# Patient Record
Sex: Male | Born: 1970 | Race: Black or African American | Hispanic: No | Marital: Married | Smoking: Former smoker
Health system: Southern US, Community
[De-identification: ages and names within clinical notes are randomized; demographics above are authoritative.]

## PROBLEM LIST (undated history)

## (undated) DIAGNOSIS — I10 Essential (primary) hypertension: Secondary | ICD-10-CM

## (undated) HISTORY — PX: BACK SURGERY: SHX140

---

## 2008-06-17 ENCOUNTER — Ambulatory Visit (HOSPITAL_COMMUNITY): Admission: RE | Admit: 2008-06-17 | Discharge: 2008-06-17 | Payer: Self-pay | Admitting: Family Medicine

## 2009-04-22 ENCOUNTER — Ambulatory Visit (HOSPITAL_COMMUNITY): Admission: RE | Admit: 2009-04-22 | Discharge: 2009-04-23 | Payer: Self-pay | Admitting: Orthopaedic Surgery

## 2011-02-23 LAB — COMPREHENSIVE METABOLIC PANEL
Albumin: 4 g/dL (ref 3.5–5.2)
BUN: 6 mg/dL (ref 6–23)
Chloride: 107 mEq/L (ref 96–112)
Creatinine, Ser: 0.85 mg/dL (ref 0.4–1.5)
Glucose, Bld: 79 mg/dL (ref 70–99)
Total Bilirubin: 0.6 mg/dL (ref 0.3–1.2)

## 2011-02-23 LAB — URINALYSIS, ROUTINE W REFLEX MICROSCOPIC
Hgb urine dipstick: NEGATIVE
Nitrite: NEGATIVE
Specific Gravity, Urine: 1.029 (ref 1.005–1.030)
Urobilinogen, UA: 1 mg/dL (ref 0.0–1.0)

## 2011-02-23 LAB — URINE MICROSCOPIC-ADD ON

## 2011-02-23 LAB — CBC
HCT: 50.2 % (ref 39.0–52.0)
MCV: 92.1 fL (ref 78.0–100.0)
Platelets: 194 10*3/uL (ref 150–400)
RDW: 12.8 % (ref 11.5–15.5)

## 2011-03-31 NOTE — Op Note (Signed)
NAME:  Matthew Beltran, Matthew Beltran               ACCOUNT NO.:  [id number]   MEDICAL RECORD NO.:  [id number]          PATIENT TYPE:  OIB   LOCATION:  5036                         FACILITY:  [facility]   PHYSICIAN:  Mark C. Ophelia Charter, M.D.    DATE OF BIRTH:  15-Oct-1971   DATE OF PROCEDURE:  04/22/2009  DATE OF DISCHARGE:                               OPERATIVE REPORT   PREOPERATIVE DIAGNOSIS:  Left L5-S1 herniated nucleus pulposus with  radiculopathy.   POSTOPERATIVE DIAGNOSIS:  Left L5-S1 herniated nucleus pulposus with  radiculopathy.   PROCEDURE:  Left L5-S1 microdiskectomy, removal of free fragment.   SURGEON:  Mark C. Ophelia Charter, MD   ASSISTANT:  Wende Neighbors, PA   ANESTHESIA:  GOT plus Marcaine skin local.   ESTIMATED BLOOD LOSS:  Minimal.   This 40 year old male has had back and left leg pain off and on with  incapacitating pain requiring narcotic usage for many weeks with leg  weakness, progressive motor deficit, inability to do the single-stance  toe raise secondary to gastroc soleus weakness and giving way in his  leg.  MRI scan shows a large HNP with caudal migration causing  significant central and left compression at L5-S1.   PROCEDURE:  After standard prepping and draping with the patient in the  prone position on chest rolls, arms at his side 90 and 90,  with rolled  yellow pads on the shoulders, back was prepped with DuraPrep.  A warmer  was placed as well as calf pumpers for DVT prophylaxis.  Surgical time-  out checklist was completed after prepping and draping with Betadine  biodrape and needle localization at L5-S1 with cross-table lateral x-ray  confirming the needle at the appropriate level.  A 2-cm incision was  made.  The patient very thin and subperiosteal dissection over the  lamina was performed with self-retaining retractor placed.  Small  laminotomy was performed and as soon as some of the ligamentum had been  removed, nerve root was extremely tight just moving slightly  at the  edge, a massive fragment was visualized and no second x-ray was  necessary due to the L5-S1 level with massive fragment immediately  identified with the operative microscope.  Nerve root was gently  retracted towards the midline.  Fragment was teased and multiple chunks  of disk were removed with micropituitary teasing more pieces with the  ball-tip nerve hook.  Disk space was tightened and only would allow the  passage of micropituitary straight as well as up and down biting.  Cloward curette was introduced.  There was minimal amount of disk  material remaining.  The patient had blown most to the disk out with the  herniation.  Fragments were removed.  Sac was removed where the fragment  had been sitting and passes were made with a hockey stick into the dura.  Foramina was enlarged and inspection around the nerve, central cephalad  caudad was performed with no remaining areas of compression.  Hockey  stick could be passed anterior to the dura with 180 degrees sweep with  no areas of compression.  Disk space was  irrigated.  The patient  received preoperative Ancef prior to incision.  After irrigation, final  passage were made, small vein was coagulated with bipolar cautery in the  gutter.  Bone was removed out to the level of the pedicle and then  the patient had layered closure with 0 Vicryl interrupted and the fascia  with 2-0 Vicryl subcutaneous tissue, 4-0 Vicryl subcuticular closure.  Dura was intact and Dermabond was applied.  Postop dressing and  transferred to the recovery room in stable condition.      Mark C. Ophelia Charter, M.D.  Electronically Signed     MCY/MEDQ  D:  04/22/2009  T:  04/23/2009  Job:  [id number]

## 2011-06-09 ENCOUNTER — Emergency Department (HOSPITAL_COMMUNITY): Payer: Managed Care, Other (non HMO)

## 2011-06-09 ENCOUNTER — Emergency Department (HOSPITAL_COMMUNITY)
Admission: EM | Admit: 2011-06-09 | Discharge: 2011-06-09 | Disposition: A | Discharge status: Home / Self Care | Payer: Managed Care, Other (non HMO) | Attending: Emergency Medicine | Admitting: Emergency Medicine

## 2011-06-09 DIAGNOSIS — M542 Cervicalgia: Secondary | ICD-10-CM | POA: Insufficient documentation

## 2011-06-09 DIAGNOSIS — Z9889 Other specified postprocedural states: Secondary | ICD-10-CM | POA: Insufficient documentation

## 2014-12-27 ENCOUNTER — Other Ambulatory Visit: Payer: Self-pay | Admitting: Orthopaedic Surgery

## 2014-12-27 DIAGNOSIS — M545 Low back pain: Secondary | ICD-10-CM

## 2014-12-31 ENCOUNTER — Other Ambulatory Visit: Payer: Managed Care, Other (non HMO)

## 2015-01-02 ENCOUNTER — Ambulatory Visit
Admission: RE | Admit: 2015-01-02 | Discharge: 2015-01-02 | Disposition: A | Discharge status: Home / Self Care | Payer: Managed Care, Other (non HMO) | Source: Ambulatory Visit | Attending: Orthopaedic Surgery | Admitting: Orthopaedic Surgery

## 2015-01-02 DIAGNOSIS — M545 Low back pain: Secondary | ICD-10-CM

## 2015-10-16 ENCOUNTER — Other Ambulatory Visit: Payer: Self-pay | Admitting: Surgery

## 2015-11-21 ENCOUNTER — Encounter (HOSPITAL_BASED_OUTPATIENT_CLINIC_OR_DEPARTMENT_OTHER): Payer: Self-pay | Admitting: *Deleted

## 2015-11-26 ENCOUNTER — Other Ambulatory Visit: Payer: Self-pay

## 2015-11-26 ENCOUNTER — Encounter (HOSPITAL_BASED_OUTPATIENT_CLINIC_OR_DEPARTMENT_OTHER)
Admission: RE | Admit: 2015-11-26 | Discharge: 2015-11-26 | Disposition: A | Discharge status: Home / Self Care | Payer: Managed Care, Other (non HMO) | Source: Ambulatory Visit | Attending: Surgery | Admitting: Surgery

## 2015-11-26 DIAGNOSIS — I10 Essential (primary) hypertension: Secondary | ICD-10-CM | POA: Diagnosis not present

## 2015-11-26 DIAGNOSIS — R221 Localized swelling, mass and lump, neck: Secondary | ICD-10-CM | POA: Diagnosis present

## 2015-11-26 DIAGNOSIS — I4510 Unspecified right bundle-branch block: Secondary | ICD-10-CM | POA: Diagnosis not present

## 2015-11-26 DIAGNOSIS — E7800 Pure hypercholesterolemia, unspecified: Secondary | ICD-10-CM | POA: Diagnosis not present

## 2015-11-26 DIAGNOSIS — Z87891 Personal history of nicotine dependence: Secondary | ICD-10-CM | POA: Diagnosis not present

## 2015-11-26 LAB — BASIC METABOLIC PANEL
Anion gap: 8 (ref 5–15)
CALCIUM: 8.7 mg/dL — AB (ref 8.9–10.3)
CHLORIDE: 104 mmol/L (ref 101–111)
CO2: 26 mmol/L (ref 22–32)
CREATININE: 1.05 mg/dL (ref 0.61–1.24)
Glucose, Bld: 149 mg/dL — ABNORMAL HIGH (ref 65–99)
Potassium: 3.8 mmol/L (ref 3.5–5.1)
SODIUM: 138 mmol/L (ref 135–145)

## 2015-11-27 NOTE — H&P (Signed)
  Matthew PaMichael Beltran  [facility] Patient #: [id number] DOB: Jun 07, 1971 Married / Language: English / Race: Black or African American Male   History of Present Illness Matthew Beltran(Jojuan Champney A. Magnus IvanBlackman MD; 10/16/2015 3:16 PM) The patient is a 45 year old male who presents with a complaint of Mass. This gentleman is referred to me by Dr. Knox RoyaltyEnrico Jones for evaluation of a left posterior neck mass. The patient reports noticing the mass approximately 8 months ago. It is slowly getting larger. It causes very mild discomfort. He has had no previous similar masses. He denies any trauma or drainage. He is otherwise healthy without complaints.   Other Problems  Back Pain Chest pain High blood pressure Hypercholesterolemia   Spinal Surgery - Lower Back  Diagnostic Studies History Gilmer Mor(Sonya Bynum, CMA;  Colonoscopy never  Allergies Lamar Laundry(Sonya Bynum, CMA; 1 No Known Drug Allergies11/30/2016  Medication History (Sonya Bynum, CMA;  AmLODIPine Besylate (5MG  Tablet, Oral) Active. Medications Reconciled  Social History Lamar Laundry(Sonya Bynum, CMA;  Alcohol use Remotely quit alcohol use. Caffeine use Carbonated beverages, Coffee. No drug use Tobacco use Former smoker.  Family History Gilmer Mor(Sonya Bynum, CMA;  Arthritis Mother. Hypertension Father, Mother, Sister. Migraine Headache Sister. Prostate Cancer Father. Thyroid problems Mother.    Review of Systems (Sonya Bynum CMA; 1 General Not Present- Appetite Loss, Chills, Fatigue, Fever, Night Sweats, Weight Gain and Weight Loss. Skin Not Present- Change in Wart/Mole, Dryness, Hives, Jaundice, New Lesions, Non-Healing Wounds, Rash and Ulcer. HEENT Present- Wears glasses/contact lenses. Not Present- Earache, Hearing Loss, Hoarseness, Nose Bleed, Oral Ulcers, Ringing in the Ears, Seasonal Allergies, Sinus Pain, Sore Throat, Visual Disturbances and Yellow Eyes. Respiratory Present- Snoring. Not Present- Bloody sputum, Chronic Cough,  Difficulty Breathing and Wheezing. Breast Not Present- Breast Mass, Breast Pain, Nipple Discharge and Skin Changes. Cardiovascular Present- Chest Pain. Not Present- Difficulty Breathing Lying Down, Leg Cramps, Palpitations, Rapid Heart Rate, Shortness of Breath and Swelling of Extremities. Gastrointestinal Not Present- Abdominal Pain, Bloating, Bloody Stool, Change in Bowel Habits, Chronic diarrhea, Constipation, Difficulty Swallowing, Excessive gas, Gets full quickly at meals, Hemorrhoids, Indigestion, Nausea, Rectal Pain and Vomiting. Male Genitourinary Not Present- Blood in Urine, Change in Urinary Stream, Frequency, Impotence, Nocturia, Painful Urination, Urgency and Urine Leakage.  Vitals (Sonya Bynum CMA;   Weight: 202 lb Height: 74in Body Surface Area: 2.18 m Body Mass Index: 25.93 kg/m  Temp.: 97.70F(Temporal)  Pulse: 74 (Regular)  BP: 128/70 (Sitting, Left Arm, Standard)       Physical Exam (Chloee Tena A. Magnus IvanBlackman MD;  The physical exam findings are as follows: Note:On physical examination, he is well appearance Lungs are clear bilaterally Cardiovascular is regular rate and rhythm There is a 2 cm left posterior neck mass which is slightly deep and mobile. I can palpate no anterior cervical adenopathy. There are no skin changes.    Assessment & Plan (Pollyanna Levay A. Magnus IvanBlackman MD;  MASS OF LEFT SIDE OF NECK (R22.1)  Impression: Left neck mass of uncertain etiology. Surgical excision of this is recommended for histologic evaluation to rule out malignancy. This may represent an enlarged lymph node as it is quite firm although it still may represent a lipoma or a dermoid tumor. I discussed the surgical procedure with him in detail. I discussed the risks of surgery which includes but is not limited to bleeding, infection, injury to surrounding structures, postoperative recovery, etc. He understands and wishes to proceed with surgery

## 2015-11-28 ENCOUNTER — Ambulatory Visit (HOSPITAL_BASED_OUTPATIENT_CLINIC_OR_DEPARTMENT_OTHER): Payer: Managed Care, Other (non HMO) | Admitting: Certified Registered"

## 2015-11-28 ENCOUNTER — Encounter (HOSPITAL_BASED_OUTPATIENT_CLINIC_OR_DEPARTMENT_OTHER)
Admission: RE | Disposition: A | Discharge status: Home / Self Care | Payer: Self-pay | Source: Ambulatory Visit | Attending: Surgery

## 2015-11-28 ENCOUNTER — Ambulatory Visit (HOSPITAL_BASED_OUTPATIENT_CLINIC_OR_DEPARTMENT_OTHER)
Admission: RE | Admit: 2015-11-28 | Discharge: 2015-11-28 | Disposition: A | Discharge status: Home / Self Care | Payer: Managed Care, Other (non HMO) | Source: Ambulatory Visit | Attending: Surgery | Admitting: Surgery

## 2015-11-28 ENCOUNTER — Encounter (HOSPITAL_BASED_OUTPATIENT_CLINIC_OR_DEPARTMENT_OTHER): Payer: Self-pay | Admitting: Certified Registered"

## 2015-11-28 DIAGNOSIS — Z87891 Personal history of nicotine dependence: Secondary | ICD-10-CM | POA: Insufficient documentation

## 2015-11-28 DIAGNOSIS — E7800 Pure hypercholesterolemia, unspecified: Secondary | ICD-10-CM | POA: Insufficient documentation

## 2015-11-28 DIAGNOSIS — R221 Localized swelling, mass and lump, neck: Secondary | ICD-10-CM | POA: Diagnosis not present

## 2015-11-28 DIAGNOSIS — I10 Essential (primary) hypertension: Secondary | ICD-10-CM | POA: Insufficient documentation

## 2015-11-28 DIAGNOSIS — I4510 Unspecified right bundle-branch block: Secondary | ICD-10-CM | POA: Insufficient documentation

## 2015-11-28 HISTORY — PX: CYST EXCISION: SHX5701

## 2015-11-28 HISTORY — DX: Essential (primary) hypertension: I10

## 2015-11-28 SURGERY — CYST REMOVAL
Anesthesia: General | Site: Neck | Laterality: Left

## 2015-11-28 MED ORDER — ONDANSETRON HCL 4 MG/2ML IJ SOLN: 4.0000 mg | Freq: Once | INTRAMUSCULAR | Status: DC | PRN

## 2015-11-28 MED ORDER — ONDANSETRON HCL 4 MG/2ML IJ SOLN
INTRAMUSCULAR | Status: AC
  Filled 2015-11-28: qty 2

## 2015-11-28 MED ORDER — SCOPOLAMINE 1 MG/3DAYS TD PT72: 1.0000 | MEDICATED_PATCH | Freq: Once | TRANSDERMAL | Status: DC | PRN

## 2015-11-28 MED ORDER — SODIUM CHLORIDE 0.9 % IJ SOLN: 3.0000 mL | Freq: Two times a day (BID) | INTRAMUSCULAR | Status: DC

## 2015-11-28 MED ORDER — HYDROCODONE-ACETAMINOPHEN 5-325 MG PO TABS: 1.0000 | ORAL_TABLET | ORAL | Status: AC | PRN

## 2015-11-28 MED ORDER — FENTANYL CITRATE (PF) 100 MCG/2ML IJ SOLN
50.0000 ug | INTRAMUSCULAR | Status: DC | PRN
  Administered 2015-11-28: 100 ug via INTRAVENOUS

## 2015-11-28 MED ORDER — PHENOL 1.4 % MT LIQD
2.0000 | OROMUCOSAL | Status: DC | PRN
  Administered 2015-11-28: 2 via OROMUCOSAL

## 2015-11-28 MED ORDER — OXYCODONE HCL 5 MG PO TABS: 5.0000 mg | ORAL_TABLET | ORAL | Status: DC | PRN

## 2015-11-28 MED ORDER — FENTANYL CITRATE (PF) 100 MCG/2ML IJ SOLN
INTRAMUSCULAR | Status: AC
  Filled 2015-11-28: qty 2

## 2015-11-28 MED ORDER — MIDAZOLAM HCL 2 MG/2ML IJ SOLN
1.0000 mg | INTRAMUSCULAR | Status: DC | PRN
  Administered 2015-11-28: 2 mg via INTRAVENOUS

## 2015-11-28 MED ORDER — GLYCOPYRROLATE 0.2 MG/ML IJ SOLN
INTRAMUSCULAR | Status: AC
  Filled 2015-11-28: qty 1

## 2015-11-28 MED ORDER — LIDOCAINE HCL (CARDIAC) 20 MG/ML IV SOLN
INTRAVENOUS | Status: AC
  Filled 2015-11-28: qty 5

## 2015-11-28 MED ORDER — GLYCOPYRROLATE 0.2 MG/ML IJ SOLN
0.2000 mg | Freq: Once | INTRAMUSCULAR | Status: AC | PRN
  Administered 2015-11-28: 0.2 mg via INTRAVENOUS

## 2015-11-28 MED ORDER — LIDOCAINE HCL (CARDIAC) 20 MG/ML IV SOLN
INTRAVENOUS | Status: DC | PRN
  Administered 2015-11-28: 100 mg via INTRAVENOUS

## 2015-11-28 MED ORDER — ACETAMINOPHEN 650 MG RE SUPP: 650.0000 mg | RECTAL | Status: DC | PRN

## 2015-11-28 MED ORDER — ACETAMINOPHEN 325 MG PO TABS: 650.0000 mg | ORAL_TABLET | ORAL | Status: DC | PRN

## 2015-11-28 MED ORDER — MIDAZOLAM HCL 2 MG/2ML IJ SOLN
INTRAMUSCULAR | Status: AC
  Filled 2015-11-28: qty 2

## 2015-11-28 MED ORDER — DEXAMETHASONE SODIUM PHOSPHATE 4 MG/ML IJ SOLN
INTRAMUSCULAR | Status: DC | PRN
  Administered 2015-11-28: 10 mg via INTRAVENOUS

## 2015-11-28 MED ORDER — SODIUM CHLORIDE 0.9 % IV SOLN
250.0000 mL | INTRAVENOUS | Status: DC | PRN
Start: 2015-11-28 — End: 2015-11-28

## 2015-11-28 MED ORDER — DEXAMETHASONE SODIUM PHOSPHATE 10 MG/ML IJ SOLN
INTRAMUSCULAR | Status: AC
  Filled 2015-11-28: qty 1

## 2015-11-28 MED ORDER — FENTANYL CITRATE (PF) 100 MCG/2ML IJ SOLN: 25.0000 ug | INTRAMUSCULAR | Status: DC | PRN

## 2015-11-28 MED ORDER — MORPHINE SULFATE (PF) 2 MG/ML IV SOLN: 1.0000 mg | INTRAVENOUS | Status: DC | PRN

## 2015-11-28 MED ORDER — ONDANSETRON HCL 4 MG/2ML IJ SOLN
INTRAMUSCULAR | Status: DC | PRN
  Administered 2015-11-28: 4 mg via INTRAVENOUS

## 2015-11-28 MED ORDER — PROPOFOL 10 MG/ML IV BOLUS
INTRAVENOUS | Status: DC | PRN
  Administered 2015-11-28: 300 mg via INTRAVENOUS
  Administered 2015-11-28: 200 mg via INTRAVENOUS

## 2015-11-28 MED ORDER — BUPIVACAINE-EPINEPHRINE 0.5% -1:200000 IJ SOLN
INTRAMUSCULAR | Status: DC | PRN
  Administered 2015-11-28: 10 mL

## 2015-11-28 MED ORDER — SODIUM CHLORIDE 0.9 % IJ SOLN: 3.0000 mL | INTRAMUSCULAR | Status: DC | PRN

## 2015-11-28 MED ORDER — PHENOL 1.4 % MT LIQD
1.0000 | OROMUCOSAL | Status: DC | PRN
  Filled 2015-11-28: qty 177

## 2015-11-28 MED ORDER — LACTATED RINGERS IV SOLN
INTRAVENOUS | Status: DC
  Administered 2015-11-28: 10 mL/h via INTRAVENOUS
  Administered 2015-11-28: 13:00:00 via INTRAVENOUS

## 2015-11-28 MED ORDER — CEFAZOLIN SODIUM-DEXTROSE 2-3 GM-% IV SOLR
2.0000 g | INTRAVENOUS | Status: AC
  Administered 2015-11-28: 2 g via INTRAVENOUS

## 2015-11-28 MED ORDER — CEFAZOLIN SODIUM-DEXTROSE 2-3 GM-% IV SOLR
INTRAVENOUS | Status: AC
  Filled 2015-11-28: qty 50

## 2015-11-28 SURGICAL SUPPLY — 36 items
BLADE HEX COATED 2.75 (ELECTRODE) ×2 IMPLANT
BLADE SURG 15 STRL LF DISP TIS (BLADE) ×1 IMPLANT
BLADE SURG 15 STRL SS (BLADE) ×2
CANISTER SUCT 1200ML W/VALVE (MISCELLANEOUS) IMPLANT
CHLORAPREP W/TINT 26ML (MISCELLANEOUS) ×2 IMPLANT
COVER BACK TABLE 60X90IN (DRAPES) ×2 IMPLANT
COVER MAYO STAND STRL (DRAPES) ×2 IMPLANT
DECANTER SPIKE VIAL GLASS SM (MISCELLANEOUS) IMPLANT
DRAPE LAPAROTOMY 100X72 PEDS (DRAPES) ×2 IMPLANT
DRAPE UTILITY XL STRL (DRAPES) ×2 IMPLANT
ELECT REM PT RETURN 9FT ADLT (ELECTROSURGICAL) ×2
ELECTRODE REM PT RTRN 9FT ADLT (ELECTROSURGICAL) ×1 IMPLANT
GLOVE BIOGEL PI IND STRL 6.5 (GLOVE) IMPLANT
GLOVE BIOGEL PI INDICATOR 6.5 (GLOVE) ×1
GLOVE ECLIPSE 6.5 STRL STRAW (GLOVE) ×1 IMPLANT
GLOVE SURG SIGNA 7.5 PF LTX (GLOVE) ×2 IMPLANT
GOWN STRL REUS W/ TWL LRG LVL3 (GOWN DISPOSABLE) ×1 IMPLANT
GOWN STRL REUS W/ TWL XL LVL3 (GOWN DISPOSABLE) ×1 IMPLANT
GOWN STRL REUS W/TWL LRG LVL3 (GOWN DISPOSABLE) ×2
GOWN STRL REUS W/TWL XL LVL3 (GOWN DISPOSABLE) ×2
LIQUID BAND (GAUZE/BANDAGES/DRESSINGS) ×4 IMPLANT
NDL HYPO 25X1 1.5 SAFETY (NEEDLE) ×1 IMPLANT
NEEDLE HYPO 25X1 1.5 SAFETY (NEEDLE) ×2 IMPLANT
NS IRRIG 1000ML POUR BTL (IV SOLUTION) IMPLANT
PACK BASIN DAY SURGERY FS (CUSTOM PROCEDURE TRAY) ×2 IMPLANT
PENCIL BUTTON HOLSTER BLD 10FT (ELECTRODE) ×2 IMPLANT
SLEEVE SCD COMPRESS KNEE MED (MISCELLANEOUS) ×1 IMPLANT
SPONGE LAP 4X18 X RAY DECT (DISPOSABLE) ×2 IMPLANT
SUT MNCRL AB 4-0 PS2 18 (SUTURE) ×2 IMPLANT
SUT VIC AB 3-0 SH 27 (SUTURE) ×2
SUT VIC AB 3-0 SH 27X BRD (SUTURE) ×1 IMPLANT
SYR CONTROL 10ML LL (SYRINGE) ×2 IMPLANT
TOWEL OR 17X24 6PK STRL BLUE (TOWEL DISPOSABLE) ×2 IMPLANT
TOWEL OR NON WOVEN STRL DISP B (DISPOSABLE) ×2 IMPLANT
TUBE CONNECTING 20X1/4 (TUBING) IMPLANT
YANKAUER SUCT BULB TIP NO VENT (SUCTIONS) IMPLANT

## 2015-11-28 NOTE — Discharge Instructions (Signed)
Ok to shower starting tomorrow ° °Ice pack and ibuprofen also for pain ° ° ° °Post Anesthesia Home Care Instructions ° °Activity: °Get plenty of rest for the remainder of the day. A responsible adult should stay with you for 24 hours following the procedure.  °For the next 24 hours, DO NOT: °-Drive a car °-Operate machinery °-Drink alcoholic beverages °-Take any medication unless instructed by your physician °-Make any legal decisions or sign important papers. ° °Meals: °Start with liquid foods such as gelatin or soup. Progress to regular foods as tolerated. Avoid greasy, spicy, heavy foods. If nausea and/or vomiting occur, drink only clear liquids until the nausea and/or vomiting subsides. Call your physician if vomiting continues. ° °Special Instructions/Symptoms: °Your throat may feel dry or sore from the anesthesia or the breathing tube placed in your throat during surgery. If this causes discomfort, gargle with warm salt water. The discomfort should disappear within 24 hours. ° °If you had a scopolamine patch placed behind your ear for the management of post- operative nausea and/or vomiting: ° °1. The medication in the patch is effective for 72 hours, after which it should be removed.  Wrap patch in a tissue and discard in the trash. Wash hands thoroughly with soap and water. °2. You may remove the patch earlier than 72 hours if you experience unpleasant side effects which may include dry mouth, dizziness or visual disturbances. °3. Avoid touching the patch. Wash your hands with soap and water after contact with the patch. °  ° °

## 2015-11-28 NOTE — Op Note (Signed)
EXCISION LEFT NECK MASS  Procedure Note  Willette PaMichael Zieger 11/28/2015   Pre-op Diagnosis: Left neck mass     Post-op Diagnosis: same  Procedure(s): EXCISION LEFT NECK MASS (2 cm)  Surgeon(s): Abigail Miyamotoouglas Mulki Roesler, MD  Anesthesia: General  Staff:  Circulator: Bernita RaisinJolie M Wells, RN Scrub Person: Salley ScarletMary B Davidson, RN  Estimated Blood Loss: Minimal               Specimens: sent to path          Jenn[facility] Stuart Medical CenterBLACKMAN,Madelein Mahadeo A   Date: 11/28/2015  Time: 11:53 AM

## 2015-11-28 NOTE — Interval H&P Note (Signed)
History and Physical Interval Note: NO CHANGE IN H and P  11/28/2015 11:07 AM  Matthew Beltran  has presented today for surgery, with the diagnosis of Left neck mass  The various methods of treatment have been discussed with the patient and family. After consideration of risks, benefits and other options for treatment, the patient has consented to  Procedure(s): EXCISION LEFT NECK MASS (Left) as a surgical intervention .  The patient's history has been reviewed, patient examined, no change in status, stable for surgery.  I have reviewed the patient's chart and labs.  Questions were answered to the patient's satisfaction.     [name]

## 2015-11-28 NOTE — Transfer of Care (Signed)
Immediate Anesthesia Transfer of Care Note  Patient: Matthew Beltran  Procedure(s) Performed: Procedure(s): EXCISION LEFT NECK MASS (Left)  Patient Location: PACU  Anesthesia Type:General  Level of Consciousness: sedated, lethargic and responds to stimulation  Airway & Oxygen Therapy: Patient Spontanous Breathing and Patient connected to face mask oxygen  Post-op Assessment: Report given to RN and Post -op Vital signs reviewed and stable  Post vital signs: Reviewed and stable  Last Vitals:  Filed Vitals:   11/28/15 1031  BP: 127/83  Pulse: 73  Temp: 36.8 C  Resp: 20    Complications: No apparent anesthesia complications

## 2015-11-28 NOTE — Progress Notes (Signed)
sweeling at lat lt neck away from incision site non tender to palpation ,  Dr Katrinka Blazingsmith in to see. bilat bs clear..  No creptus in area .  Just watch per dr Katrinka Blazingsmith  Swallowing  without problem,  No active bleeding noted in back of throat

## 2015-11-28 NOTE — Anesthesia Procedure Notes (Signed)
Procedure Name: LMA Insertion Date/Time: 11/28/2015 11:29 AM Performed by: Curly ShoresRAFT, Yecheskel Kurek W Pre-anesthesia Checklist: Patient identified, Emergency Drugs available, Suction available and Patient being monitored Patient Re-evaluated:Patient Re-evaluated prior to inductionOxygen Delivery Method: Circle System Utilized Preoxygenation: Pre-oxygenation with 100% oxygen Intubation Type: IV induction Ventilation: Mask ventilation without difficulty LMA: LMA inserted and LMA flexible inserted LMA Size: 5.0 Number of attempts: 2 (unable to seat flexible LMA.  #5 unique inserted easily) Airway Equipment and Method: Bite block Placement Confirmation: positive ETCO2 Tube secured with: Tape Dental Injury: Teeth and Oropharynx as per pre-operative assessment

## 2015-11-28 NOTE — Anesthesia Preprocedure Evaluation (Signed)
Anesthesia Evaluation  Patient identified by MRN, date of birth, ID band Patient awake    Reviewed: Allergy & Precautions, H&P , NPO status , Patient's Chart, lab work & pertinent test results  History of Anesthesia Complications Negative for: history of anesthetic complications  Airway Mallampati: II  TM Distance: >3 FB Neck ROM: full    Dental no notable dental hx.    Pulmonary former smoker,    Pulmonary exam normal breath sounds clear to auscultation       Cardiovascular hypertension, On Medications Normal cardiovascular exam Rhythm:regular Rate:Normal     Neuro/Psych negative neurological ROS     GI/Hepatic negative GI ROS, Neg liver ROS,   Endo/Other  negative endocrine ROS  Renal/GU negative Renal ROS     Musculoskeletal   Abdominal   Peds  Hematology negative hematology ROS (+)   Anesthesia Other Findings Left neck mass  Reproductive/Obstetrics negative OB ROS                             Anesthesia Physical Anesthesia Plan  ASA: II  Anesthesia Plan: General   Post-op Pain Management:    Induction: Intravenous  Airway Management Planned: Oral ETT  Additional Equipment:   Intra-op Plan:   Post-operative Plan: Extubation in OR  Informed Consent: I have reviewed the patients History and Physical, chart, labs and discussed the procedure including the risks, benefits and alternatives for the proposed anesthesia with the patient or authorized representative who has indicated his/her understanding and acceptance.   Dental Advisory Given  Plan Discussed with: Anesthesiologist, CRNA and Surgeon  Anesthesia Plan Comments:         Anesthesia Quick Evaluation

## 2015-11-29 ENCOUNTER — Encounter (HOSPITAL_BASED_OUTPATIENT_CLINIC_OR_DEPARTMENT_OTHER): Payer: Self-pay | Admitting: Surgery

## 2015-11-29 NOTE — Op Note (Signed)
[name]:  Finklea, Vitali               ACCOUNT NO.:  [id number]  MEDICAL RECORD NO.:  [id number]  LOCATION:                                 FACILITY:  PHYSICIAN:  Abigail Miyamotoouglas Waymond Meador, M.D.      DATE OF BIRTH:  DATE OF PROCEDURE:  11/28/2015 DATE OF DISCHARGE:                              OPERATIVE REPORT   PREOPERATIVE DIAGNOSIS:  Left neck mass.  POSTOPERATIVE DIAGNOSIS:  Left neck mass.  PROCEDURE:  Excision of 2 cm left neck mass.  SURGEON:  Abigail Miyamotoouglas Katherene Dinino, M.D.  ANESTHESIA:  General with 0.5% Marcaine.  ESTIMATED BLOOD LOSS:  Minimal.  FINDINGS:  The patient was found to have a 2 cm mass, which appeared consistent with a lipoma.  It was sent to Pathology for evaluation.  PROCEDURE IN DETAIL:  The patient was brought to the operating room, identified as Matthew PaMichael Cdebaca, he was placed supine on the operating room table, where general anesthesia was induced.  His head was then turned to the right.  His neck was prepped and draped in usual sterile fashion. The palpable mass was in the posterior triangle.  I anesthetized skin over the mass with lidocaine and made incision with a scalpel.  I took this down through his very thick skin and platysma with electrocautery. I then was able to easily identify the mass which had the appearance of a lipoma.  I was able to grasp it with a clamp, elevate it, and excise with electrocautery.  It was then sent to Pathology for evaluation. Hemostasis was achieved with cautery.  I then anesthetized the wound further with Marcaine.  I closed the platysma with interrupted 3-0 Vicryl sutures and closed skin with a running 4 Monocryl.  Skin glue was then applied.  The patient tolerated the procedure well.  All the counts were correct at the end of procedure.  The patient was then extubated in the operating room and taken in stable condition to recovery room.     Abigail Miyamotoouglas Rainer Mounce, M.D.     DB/MEDQ  D:  11/28/2015  T:  11/28/2015  Job:  [id number]

## 2015-12-02 NOTE — Anesthesia Postprocedure Evaluation (Signed)
Anesthesia Post Note  Patient: Matthew Beltran  Procedure(s) Performed: Procedure(s) (LRB): EXCISION LEFT NECK MASS (Left)  Patient location during evaluation: PACU Anesthesia Type: General Level of consciousness: awake and alert Pain management: pain level controlled Vital Signs Assessment: post-procedure vital signs reviewed and stable Respiratory status: spontaneous breathing, nonlabored ventilation and respiratory function stable Cardiovascular status: blood pressure returned to baseline and stable Postop Assessment: no signs of nausea or vomiting Anesthetic complications: no    Last Vitals:  Filed Vitals:   11/28/15 1330 11/28/15 1359  BP: 128/101 142/95  Pulse: 74 70  Temp:  36.5 C  Resp: 13 18    Last Pain:  Filed Vitals:   11/29/15 0958  PainSc: 1                  Matthew Beltran, Matthew Beltran

## 2016-09-03 ENCOUNTER — Ambulatory Visit (INDEPENDENT_AMBULATORY_CARE_PROVIDER_SITE_OTHER): Payer: Managed Care, Other (non HMO) | Admitting: Sports Medicine

## 2016-09-03 ENCOUNTER — Encounter (INDEPENDENT_AMBULATORY_CARE_PROVIDER_SITE_OTHER): Payer: Self-pay | Admitting: Sports Medicine

## 2016-09-03 DIAGNOSIS — M4726 Other spondylosis with radiculopathy, lumbar region: Secondary | ICD-10-CM | POA: Diagnosis not present

## 2016-09-03 DIAGNOSIS — M5442 Lumbago with sciatica, left side: Secondary | ICD-10-CM | POA: Diagnosis not present

## 2016-09-03 DIAGNOSIS — M5441 Lumbago with sciatica, right side: Secondary | ICD-10-CM

## 2016-09-07 ENCOUNTER — Other Ambulatory Visit: Payer: Self-pay | Admitting: Sports Medicine

## 2016-09-07 ENCOUNTER — Ambulatory Visit
Admission: RE | Admit: 2016-09-07 | Discharge: 2016-09-07 | Disposition: A | Discharge status: Home / Self Care | Payer: Managed Care, Other (non HMO) | Source: Ambulatory Visit | Attending: Sports Medicine | Admitting: Sports Medicine

## 2016-09-07 DIAGNOSIS — M545 Low back pain: Principal | ICD-10-CM

## 2016-09-07 DIAGNOSIS — G8929 Other chronic pain: Secondary | ICD-10-CM

## 2016-09-11 ENCOUNTER — Telehealth (INDEPENDENT_AMBULATORY_CARE_PROVIDER_SITE_OTHER): Payer: Self-pay | Admitting: Sports Medicine

## 2016-09-11 DIAGNOSIS — M5417 Radiculopathy, lumbosacral region: Secondary | ICD-10-CM

## 2016-09-11 NOTE — Telephone Encounter (Signed)
See msg concerning MRI results.

## 2016-09-11 NOTE — Telephone Encounter (Signed)
Talked with pt. And advised him of message.  Patient stated that he would try the epidural steroid injection if that is what you are recommending. Thank You

## 2016-09-11 NOTE — Telephone Encounter (Signed)
Patient called regarding MRI results.  Contact Info: [phone]

## 2016-09-11 NOTE — Telephone Encounter (Signed)
The patient was supposed to schedule a follow-up for MRI review. If he is having persistent symptoms we could consider referring him to Dr. Alvester MorinNewton for an epidural steroid injection but his MRI is essentially unchanged from prior and does not show an significant nerve root irritation although there are some changes present. Once again I'm happy to discuss this with him in a follow-up visit or if he would prefer to undergo an epidural steroid injection we can refer him to Dr. Alvester MorinNewton.

## 2016-09-23 ENCOUNTER — Ambulatory Visit (INDEPENDENT_AMBULATORY_CARE_PROVIDER_SITE_OTHER): Payer: Self-pay | Admitting: Orthopaedic Surgery

## 2016-09-28 ENCOUNTER — Encounter (INDEPENDENT_AMBULATORY_CARE_PROVIDER_SITE_OTHER): Payer: Self-pay | Admitting: Physical Medicine and Rehabilitation

## 2016-09-28 ENCOUNTER — Ambulatory Visit (INDEPENDENT_AMBULATORY_CARE_PROVIDER_SITE_OTHER): Payer: Managed Care, Other (non HMO) | Admitting: Physical Medicine and Rehabilitation

## 2016-09-28 VITALS — BP 142/106 | HR 80 | Temp 98.3°F

## 2016-09-28 DIAGNOSIS — M961 Postlaminectomy syndrome, not elsewhere classified: Secondary | ICD-10-CM

## 2016-09-28 DIAGNOSIS — M5416 Radiculopathy, lumbar region: Secondary | ICD-10-CM

## 2016-09-28 MED ORDER — LIDOCAINE HCL (PF) 1 % IJ SOLN
0.3300 mL | Freq: Once | INTRAMUSCULAR | Status: AC
  Administered 2016-09-28: 0.3 mL

## 2016-09-28 MED ORDER — METHYLPREDNISOLONE ACETATE 80 MG/ML IJ SUSP
80.0000 mg | Freq: Once | INTRAMUSCULAR | Status: AC
  Administered 2016-09-28: 80 mg

## 2016-09-28 NOTE — Patient Instructions (Signed)

## 2016-09-28 NOTE — Progress Notes (Signed)
Matthew Beltran - 45 y.o. male MRN [id number]  Date of birth: 09-Oct-1971  Office Visit Note: Visit Date: 09/28/2016 PCP: Paulino RilyJONES,ENRICO G, MD Referred by: Knox RoyaltyJones, Enrico, MD  Subjective: Chief Complaint  Patient presents with  . Lower Back - Pain   HPI: Mr. Matthew Beltran is a 45 year old gentleman with chronic history of back and hip and leg pain particularly on the left. He reports low back pain off and on for years. Persistent pain x 3 weeks. Sharp pain at times. No rhyme or reason. Radiating down both legs. Right equal to left. Wakes with pain at times. He denies any focal weakness. He has been seen and evaluated by Dr. Berline Choughigby who thought an epidural injection may be beneficial. The patient has failed conservative care. The patient has had an updated MRI which is reviewed below.    ROS Otherwise per HPI.  Assessment & Plan: Visit Diagnoses:  1. Lumbar radiculopathy   2. Post laminectomy syndrome     Plan: Findings:  Chronic worsening low back and bilateral hip and thigh and leg pain consistent more with discogenic type referral pattern. He doesn't have much the way of facet arthropathy on MRI. I think bilateral S1 transforaminal injections may get medication around the level of L5-S1 diagnostically and hopefully therapeutically. Depending on the relief she gets with this he may ultimately be a candidate for spinal cord stimulator if it was just something that was ongoing and persistent and recalcitrant. He has failed to a lot of conservative care. Consideration should be given to that indication tight management with nerve membrane type medications and other neuropathic pain medications.    Meds & Orders:  Meds ordered this encounter  Medications  . lidocaine (PF) (XYLOCAINE) 1 % injection 0.3 mL  . methylPREDNISolone acetate (DEPO-MEDROL) injection 80 mg    Orders Placed This Encounter  Procedures  . Epidural Steroid injection    Follow-up: Return for schedule for Dr. Berline Choughigby follow up 2  weeks.   Procedures: No procedures performed  S1 Lumbosacral Transforaminal Epidural Steroid Injection - Sub-Pedicular Approach with Fluoroscopic Guidance   Patient: Matthew PaMichael Beltran      Date of Birth: 09-Oct-1971 MRN: [id number] PCP: Paulino RilyJONES,ENRICO G, MD      Visit Date: 09/28/2016   Universal Protocol:    Date/Time: 09/28/1709:09 AM  Consent Given By: the patient  Position:  PRONE  Additional Comments: Vital signs were monitored before and after the procedure. Patient was prepped and draped in the usual sterile fashion. The correct patient, procedure, and site was verified.   Injection Procedure Details:  Procedure Site One Meds Administered:  Meds ordered this encounter  Medications  . lidocaine (PF) (XYLOCAINE) 1 % injection 0.3 mL  . methylPREDNISolone acetate (DEPO-MEDROL) injection 80 mg     Laterality: Bilateral  Location/Site:  S1 Foramen   Needle size: 22 G  Needle type: Spinal  Needle Placement: Transforaminal  Findings:  -Contrast Used: 2 mL iohexol 180 mg iodine/mL   -Comments: Excellent flow of contrast along the nerve and into the epidural space.  Procedure Details: After squaring off the sacral end-plate to get a true AP view, the C-arm was positioned so that the best possible view of the S1 foramen was visualized. The soft tissues overlying this structure were infiltrated with 2-3 ml. of 1% Lidocaine without Epinephrine.    The spinal needle was inserted toward the target using a "trajectory" view along the fluoroscope beam.  Under AP and lateral visualization, the needle was advanced so it  did not puncture dura. Biplanar projections were used to confirm position. Aspiration was confirmed to be negative for CSF and/or blood. A 1-2 ml. volume of Isovue-250 was injected and flow of contrast was noted at each level. Radiographs were obtained for documentation purposes.   After attaining the desired flow of contrast documented above, a 0.5 to 1.0 ml test  dose of 0.25% Marcaine was injected into each respective transforaminal space.  The patient was observed for 90 seconds post injection.  After no sensory deficits were reported, and normal lower extremity motor function was noted,   the above injectate was administered so that equal amounts of the injectate were placed at each foramen (level) into the transforaminal epidural space.   Additional Comments:  The patient tolerated the procedure well Dressing: Band-Aid and 2x2 sterile gauze     Post-procedure details: Patient was observed during the procedure. Post-procedure instructions were reviewed.  Patient left the clinic in stable condition.     Clinical History: Lumbar spine MRI dated 09/07/2016  IMPRESSION: Unchanged appearance of the lumbar spine. Disc degeneration and postoperative changes at L5-S1 and L4-5 disc protrusion without evidence of neural impingement.   He reports that he quit smoking about 12 months ago. He has never used smokeless tobacco. No results for input(s): HGBA1C, LABURIC in the last 8760 hours.  Objective:  VS:  HT:    WT:   BMI:     BP:(!) 142/106  HR:80bpm  TEMP:98.3 F (36.8 C)( )  RESP:98 % Physical Exam  Musculoskeletal:  The patient ambulates without aid. He has good distal strength. He has tight hamstrings bilaterally.    Ortho Exam Imaging: No results found.  Past Medical/Family/Surgical/Social History: Medications & Allergies reviewed per EMR There are no active problems to display for this patient.  Past Medical History:  Diagnosis Date  . Hypertension    No family history on file. Past Surgical History:  Procedure Laterality Date  . BACK SURGERY    . CYST EXCISION Left 11/28/2015   Procedure: EXCISION LEFT NECK MASS;  Surgeon: Abigail Miyamotoouglas Blackman, MD;  Location: [facility];  Service: General;  Laterality: Left;   Social History   Occupational History  . Not on file.   Social History Main Topics  . Smoking  status: Former Smoker    Quit date: 09/10/2015  . Smokeless tobacco: Never Used  . Alcohol use No  . Drug use: No  . Sexual activity: Not on file

## 2016-09-28 NOTE — Procedures (Signed)
S1 Lumbosacral Transforaminal Epidural Steroid Injection - Sub-Pedicular Approach with Fluoroscopic Guidance   Patient: Matthew Beltran      Date of Birth: 1971/10/28 MRN: [id number] PCP: Paulino RilyJONES,ENRICO G, MD      Visit Date: 09/28/2016   Universal Protocol:    Date/Time: 09/28/1709:09 AM  Consent Given By: the patient  Position:  PRONE  Additional Comments: Vital signs were monitored before and after the procedure. Patient was prepped and draped in the usual sterile fashion. The correct patient, procedure, and site was verified.   Injection Procedure Details:  Procedure Site One Meds Administered:  Meds ordered this encounter  Medications  . lidocaine (PF) (XYLOCAINE) 1 % injection 0.3 mL  . methylPREDNISolone acetate (DEPO-MEDROL) injection 80 mg     Laterality: Bilateral  Location/Site:  S1 Foramen   Needle size: 22 G  Needle type: Spinal  Needle Placement: Transforaminal  Findings:  -Contrast Used: 2 mL iohexol 180 mg iodine/mL   -Comments: Excellent flow of contrast along the nerve and into the epidural space.  Procedure Details: After squaring off the sacral end-plate to get a true AP view, the C-arm was positioned so that the best possible view of the S1 foramen was visualized. The soft tissues overlying this structure were infiltrated with 2-3 ml. of 1% Lidocaine without Epinephrine.    The spinal needle was inserted toward the target using a "trajectory" view along the fluoroscope beam.  Under AP and lateral visualization, the needle was advanced so it did not puncture dura. Biplanar projections were used to confirm position. Aspiration was confirmed to be negative for CSF and/or blood. A 1-2 ml. volume of Isovue-250 was injected and flow of contrast was noted at each level. Radiographs were obtained for documentation purposes.   After attaining the desired flow of contrast documented above, a 0.5 to 1.0 ml test dose of 0.25% Marcaine was injected into each  respective transforaminal space.  The patient was observed for 90 seconds post injection.  After no sensory deficits were reported, and normal lower extremity motor function was noted,   the above injectate was administered so that equal amounts of the injectate were placed at each foramen (level) into the transforaminal epidural space.   Additional Comments:  The patient tolerated the procedure well Dressing: Band-Aid and 2x2 sterile gauze     Post-procedure details: Patient was observed during the procedure. Post-procedure instructions were reviewed.  Patient left the clinic in stable condition.

## 2016-10-05 ENCOUNTER — Telehealth (INDEPENDENT_AMBULATORY_CARE_PROVIDER_SITE_OTHER): Payer: Self-pay | Admitting: Sports Medicine

## 2016-10-05 NOTE — Telephone Encounter (Signed)
Holding for you.  

## 2016-10-06 ENCOUNTER — Telehealth (INDEPENDENT_AMBULATORY_CARE_PROVIDER_SITE_OTHER): Payer: Self-pay

## 2016-10-06 NOTE — Telephone Encounter (Signed)
Called and left voicemail concerning disability forms.  Advised patient to return call.

## 2016-10-06 NOTE — Telephone Encounter (Signed)
Dictation is needed for 09/03/16 office visit to complete Disability forms. Thank You

## 2016-10-07 NOTE — Telephone Encounter (Signed)
Noted  

## 2016-10-07 NOTE — Progress Notes (Signed)
   Matthew Beltran - 45 y.o. male MRN [id number]  Date of birth: Dec 05, 1970  Office Visit Note: Visit Date: 09/03/2016 PCP: Paulino RilyJONES,ENRICO G, MD Referred by: No ref. provider found  Subjective: CC: Low back pain HPI: Long-standing issues with low back pain that has worsened over the past one week. He is having pain is radiating into both legs radiating down the posterior aspect of both legs. He is having a difficult time with walking sitting & standing. He is had numbness & tingling down the posterior aspect of both legs. He is tried taking hydrocodone without significant improvement. ROS: Denies any fevers, chills, recent weight gain or weight loss. He does have nighttime disturbances due to this. Otherwise per HPI.   Assessment & Plan: Visit Diagnoses:  1. Acute back pain with sciatica, right   2. Osteoarthritis of spine with radiculopathy, lumbar region   3. Acute back pain with sciatica, left    Plan: Patient does have positive neural tension signs as well as some weakness as evidenced on physical exam. MRI of lumbar spine is indicated given these findings & we will plan to see him back after this obtained as further options. Symptomatic treatment as below. Follow-up: After MRI Meds: Prescriptions for Norco, gabapentin & Medrol Dosepak provided.  Orders: MRI of the lumbar spine ordered  Objective:  VS: Reviewed  Physical Exam: Adult male. In no acute distress. Alert and appropriate. Back:  Well aligned. Walks in straight back position. Difficulty with toe walking. Markedly positive straight leg raise bilaterally with pain with popliteal compression test. Lower extremity sensation diminished in L5 distribution. Lower extremity reflexes diminished at the Achilles, normal at the patella.  Imaging: Findings: 2V lumbar spine: Loss of lumbar lordosis. Multilevel degenerative changes most notably at L4-5 & L5-S1.  Significant facet hypertrophy. Visualized hip joints are normal appearing without  significant degenerative change.      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  Past Medical/Family/Surgical/Social History: Medications & Allergies reviewed per SRS intake sheet and EPIC chart review There are no active problems to display for this patient.  Past Medical History:  Diagnosis Date  . Hypertension    No family history on file. Past Surgical History:  Procedure Laterality Date  . BACK SURGERY    . CYST EXCISION Left 11/28/2015   Procedure: EXCISION LEFT NECK MASS;  Surgeon: Abigail Miyamotoouglas Blackman, MD;  Location: [facility];  Service: General;  Laterality: Left;   Social History   Occupational History  . Not on file.   Social History Main Topics  . Smoking status: Former Smoker    Quit date: 09/10/2015  . Smokeless tobacco: Never Used  . Alcohol use No  . Drug use: No  . Sexual activity: Not on file        Matthew MewsMichael D Santina Trillo, DO

## 2016-10-07 NOTE — Telephone Encounter (Signed)
Completed in EPIC

## 2016-10-13 ENCOUNTER — Telehealth (INDEPENDENT_AMBULATORY_CARE_PROVIDER_SITE_OTHER): Payer: Self-pay | Admitting: Sports Medicine

## 2016-10-13 NOTE — Telephone Encounter (Signed)
Patient calling regarding the status of his FMLA and AFLAC forms.  He gave us the FMLA form on the day of his last OV (was here for injection) and submitted his AFLAC a few days later.  They are both due and the FMLA protects his job.  He has used all his PTO.  He is requesting your help in this matter.

## 2016-10-16 ENCOUNTER — Telehealth (INDEPENDENT_AMBULATORY_CARE_PROVIDER_SITE_OTHER): Payer: Self-pay

## 2016-10-16 ENCOUNTER — Ambulatory Visit (INDEPENDENT_AMBULATORY_CARE_PROVIDER_SITE_OTHER): Payer: Managed Care, Other (non HMO) | Admitting: Sports Medicine

## 2016-10-16 NOTE — Telephone Encounter (Signed)
Talked with patient and advised patient that forms are ready to be picked up from the front desk.   Patient stated that he would like for forms to be faxed.  Faxed forms to [phone], Attn; Levert FeinsteinKamara

## 2016-10-16 NOTE — Telephone Encounter (Signed)
Talked with patient and advised him that FMLA forms are ready for pick up.  Patient would also like for forms to be faxed.  Faxed FMLA forms to [phone] attn: Levert FeinsteinKamara

## 2016-10-20 ENCOUNTER — Ambulatory Visit (INDEPENDENT_AMBULATORY_CARE_PROVIDER_SITE_OTHER): Payer: Managed Care, Other (non HMO) | Admitting: Sports Medicine

## 2016-10-20 ENCOUNTER — Encounter (INDEPENDENT_AMBULATORY_CARE_PROVIDER_SITE_OTHER): Payer: Self-pay | Admitting: Sports Medicine

## 2016-10-20 VITALS — BP 128/85 | HR 66 | Ht 74.0 in | Wt 210.0 lb

## 2016-10-20 DIAGNOSIS — M5417 Radiculopathy, lumbosacral region: Secondary | ICD-10-CM

## 2016-10-20 DIAGNOSIS — M4726 Other spondylosis with radiculopathy, lumbar region: Secondary | ICD-10-CM

## 2016-10-20 DIAGNOSIS — M5441 Lumbago with sciatica, right side: Secondary | ICD-10-CM | POA: Diagnosis not present

## 2016-10-20 NOTE — Patient Instructions (Signed)
Do the exercises on a 6 day per week basis. 10-15 minutes at minimum is recommended. Please report to stop sitting on her wallet for prolonged periods as this does seem to exacerbate low back issues.

## 2016-10-20 NOTE — Progress Notes (Signed)
   Matthew Beltran - 45 y.o. male MRN [id number]  Date of birth: 06-25-1971  Office Visit Note: Visit Date: 10/20/2016 PCP: Paulino RilyJONES,ENRICO G, MD Referred by: Knox RoyaltyJones, Enrico, MD  Subjective: Chief Complaint  Patient presents with  . Lower Back - Follow-up  . Follow-up    Patient states that back feels good.  Injection helped patient states.   HPI: Patient reports 95% improvement in his pain. He denies any radicular symptoms & has only a small amount of stiffness if highly active or twisting & lifting. Injection has essentially resolved all his discomfort. No changes in bowel or bladder. No nighttime awakenings. Not having to take any medications at this time. Not performing any specific therapeutic exercises. ROS:  Otherwise per HPI.   Clinical History: No specialty comments available.  He reports that he quit smoking about 13 months ago. He has never used smokeless tobacco.  No results for input(s): HGBA1C, LABURIC in the last 8760 hours.  Assessment & Plan: Visit Diagnoses:    ICD-9-CM ICD-10-CM   1. Lumbosacral radiculopathy at S1 724.4 M54.17   2. Osteoarthritis of spine with radiculopathy, lumbar region 721.3 M47.26   3. Acute back pain with sciatica, right 724.3 M54.41     Plan: [facility] spine conditioning program provided the patient today & recommend 6 days per week until symptoms 100% improved. Given the fact that he has had a significant improvement as he had no further intervention or evaluation is needed. If any reoccurrence of pain consider repeat injection. Follow-up: Return if symptoms worsen or fail to improve.  Meds: No orders of the defined types were placed in this encounter.  Procedures: No notes on file   Objective:  VS:  HT:6\' 2"  (188 cm)   WT:210 lb (95.3 kg)  BMI:27    BP:128/85  HR:66bpm  TEMP: ( )  RESP:  Physical Exam:  Adult male. Alert and appropriate.  In no acute distress.  Lower extremities are overall well aligned with no significant deformity. No  significant swelling. DP & PT pulses 2+/4. No significant bruising/ecchymosis or erythema the skin  BACK: Bilateral negative straight leg raise. Sensation intact to light touch in bilateral lower extremities No significant midline tenderness.  TTP over Good internal and external rotation of the hips. Manual muscle testing is 5+/5 without focality  Imaging: No results found.  Past Medical/Family/Surgical/Social History: Medications & Allergies reviewed per EMR There are no active problems to display for this patient.  Past Medical History:  Diagnosis Date  . Hypertension    No family history on file. Past Surgical History:  Procedure Laterality Date  . BACK SURGERY    . CYST EXCISION Left 11/28/2015   Procedure: EXCISION LEFT NECK MASS;  Surgeon: Abigail Miyamotoouglas Blackman, MD;  Location: [facility];  Service: General;  Laterality: Left;   Social History   Occupational History  . Not on file.   Social History Main Topics  . Smoking status: Former Smoker    Quit date: 09/10/2015  . Smokeless tobacco: Never Used  . Alcohol use No  . Drug use: No  . Sexual activity: Not on file

## 2020-06-08 ENCOUNTER — Encounter (HOSPITAL_COMMUNITY): Payer: Self-pay

## 2020-06-08 ENCOUNTER — Ambulatory Visit (HOSPITAL_COMMUNITY)
Admission: EM | Admit: 2020-06-08 | Discharge: 2020-06-08 | Disposition: A | Discharge status: Home / Self Care | Payer: Managed Care, Other (non HMO) | Attending: Emergency Medicine | Admitting: Emergency Medicine

## 2020-06-08 ENCOUNTER — Other Ambulatory Visit: Payer: Self-pay

## 2020-06-08 DIAGNOSIS — Z23 Encounter for immunization: Secondary | ICD-10-CM

## 2020-06-08 DIAGNOSIS — S6991XA Unspecified injury of right wrist, hand and finger(s), initial encounter: Secondary | ICD-10-CM

## 2020-06-08 MED ORDER — LIDOCAINE HCL 2 % IJ SOLN
INTRAMUSCULAR | Status: AC
  Filled 2020-06-08: qty 20

## 2020-06-08 MED ORDER — TETANUS-DIPHTH-ACELL PERTUSSIS 5-2.5-18.5 LF-MCG/0.5 IM SUSP
INTRAMUSCULAR | Status: AC
  Filled 2020-06-08: qty 0.5

## 2020-06-08 MED ORDER — LIDOCAINE-EPINEPHRINE 1 %-1:100000 IJ SOLN
INTRAMUSCULAR | Status: AC
  Filled 2020-06-08: qty 1

## 2020-06-08 MED ORDER — TETANUS-DIPHTH-ACELL PERTUSSIS 5-2.5-18.5 LF-MCG/0.5 IM SUSP
0.5000 mL | Freq: Once | INTRAMUSCULAR | Status: AC
  Administered 2020-06-08: 0.5 mL via INTRAMUSCULAR

## 2020-06-08 NOTE — Discharge Instructions (Signed)
Cleanse daily with soap and water, keep covered to keep clean until healed close Return for any signs of infection- increased redness, warmth, swelling drainage or very tight to the finger.  Ibuprofen, ice, elevation to help with pain.

## 2020-06-08 NOTE — ED Provider Notes (Signed)
[facility]    CSN: [id number] Arrival date & time: 06/08/20  1613      History   Chief Complaint Chief Complaint  Patient presents with  . Foreign Body    HPI Matthew Beltran is a 49 y.o. male.   Matthew Beltran presents with complaints of fish hook to tip of right index finger. While putting away his fishing gear he was reaching and accidentally grabbed into the hook. It with through and through the tip of his finger. Unknown last tdap. No numbness tingling to the finger. No active bleeding.    ROS per HPI, negative if not otherwise mentioned.      Past Medical History:  Diagnosis Date  . Hypertension     There are no problems to display for this patient.   Past Surgical History:  Procedure Laterality Date  . BACK SURGERY    . CYST EXCISION Left 11/28/2015   Procedure: EXCISION LEFT NECK MASS;  Surgeon: Abigail Miyamoto, MD;  Location: [facility];  Service: General;  Laterality: Left;       Home Medications    Prior to Admission medications   Medication Sig Start Date End Date Taking? Authorizing Provider  amLODipine (NORVASC) 10 MG tablet Take 10 mg by mouth daily. Patient not taking: Reported on 06/08/2020    [provider]  HYDROcodone-acetaminophen (NORCO) 5-325 MG tablet Take 1-2 tablets by mouth every 4 (four) hours as needed for moderate pain. 11/28/15   Abigail Miyamoto, MD    Family History History reviewed. No pertinent family history.  Social History Social History   Tobacco Use  . Smoking status: Former Smoker    Quit date: 09/10/2015    Years since quitting: 4.7  . Smokeless tobacco: Never Used  Substance Use Topics  . Alcohol use: No  . Drug use: No     Allergies   Patient has no known allergies.   Review of Systems Review of Systems   Physical Exam Triage Vital Signs ED Triage Vitals  Enc Vitals Group     BP 06/08/20 1626 128/72     Pulse Rate 06/08/20 1626 66     Resp 06/08/20 1626 16      Temp 06/08/20 1626 98.3 F (36.8 C)     Temp Source 06/08/20 1626 Oral     SpO2 06/08/20 1626 100 %     Weight --      Height --      Head Circumference --      Peak Flow --      Pain Score 06/08/20 1624 3     Pain Loc --      Pain Edu? --      Excl. in GC? --    No data found.  Updated Vital Signs BP 128/72 (BP Location: Right Arm)   Pulse 66   Temp 98.3 F (36.8 C) (Oral)   Resp 16   SpO2 100%   Visual Acuity Right Eye Distance:   Left Eye Distance:   Bilateral Distance:    Right Eye Near:   Left Eye Near:    Bilateral Near:     Physical Exam Constitutional:      Appearance: He is well-developed.  Cardiovascular:     Rate and Rhythm: Normal rate.  Pulmonary:     Effort: Pulmonary effort is normal.  Musculoskeletal:     Comments: Fish hook with barb to soft tissue, through and through the soft tissue of the finger; sparing the  nail, no bony involvement, no active bleeding   Skin:    General: Skin is warm and dry.  Neurological:     Mental Status: He is alert and oriented to person, place, and time.      UC Treatments / Results  Labs (all labs ordered are listed, but only abnormal results are displayed) Labs Reviewed - No data to display  EKG   Radiology No results found.  Procedures Foreign Body Removal  Date/Time: 06/08/2020 8:57 PM Performed by: Georgetta Haber, NP Authorized by: Georgetta Haber, NP   Consent:    Consent obtained:  Verbal   Consent given by:  Patient   Risks discussed:  Bleeding, infection and pain   Alternatives discussed:  Referral, observation and no treatment Location:    Location:  Finger   Finger location:  R index finger   Depth:  Subcutaneous   Tendon involvement:  None Pre-procedure details:    Imaging:  None   Neurovascular status: intact   Anesthesia (see MAR for exact dosages):    Anesthesia method:  Local infiltration   Local anesthetic:  Lidocaine 1% w/o epi Procedure type:    Procedure  complexity:  Simple Procedure details:    Scalpel size:  11   Incision length:   1 mm   Localization method:  Visualized   Dissection of underlying tissues: no     Removal mechanism: barb further pushed completely through after 51mm incision to open exit region; wire cutters used to cut barb off and then hook pulled back out from entry site    Foreign bodies recovered:  1   Intact foreign body removal: yes   Post-procedure details:    Neurovascular status: intact     Skin closure:  None   Dressing:  Adhesive bandage   Patient tolerance of procedure:  Tolerated well, no immediate complications   (including critical care time)  Medications Ordered in UC Medications  Tdap (BOOSTRIX) injection 0.5 mL (0.5 mLs Intramuscular Given 06/08/20 1718)    Initial Impression / Assessment and Plan / UC Course  I have reviewed the triage vital signs and the nursing notes.  Pertinent labs & imaging results that were available during my care of the patient were reviewed by me and considered in my medical decision making (see chart for details).     Fish hook removed without difficulty. Wound cleansed. tdap updated. Return precautions provided. Patient verbalized understanding and agreeable to plan.   Final Clinical Impressions(s) / UC Diagnoses   Final diagnoses:  Fish hook injury of finger of right hand, initial encounter     Discharge Instructions     Cleanse daily with soap and water, keep covered to keep clean until healed close Return for any signs of infection- increased redness, warmth, swelling drainage or very tight to the finger.  Ibuprofen, ice, elevation to help with pain.     ED Prescriptions    None     PDMP not reviewed this encounter.   Georgetta Haber, NP 06/08/20 2100

## 2020-06-08 NOTE — ED Triage Notes (Signed)
PT presents to UC for fish hook in right index finger x 1hour. Pt denies meds prior to arrival. Pt unsure of last tetanus vaccine. Pt endorsing pain at site.

## 2022-01-26 ENCOUNTER — Other Ambulatory Visit: Payer: Self-pay | Admitting: Orthopedic Surgery

## 2022-01-26 DIAGNOSIS — M545 Low back pain, unspecified: Secondary | ICD-10-CM

## 2022-02-24 ENCOUNTER — Other Ambulatory Visit: Payer: Managed Care, Other (non HMO)

## 2022-02-24 ENCOUNTER — Ambulatory Visit
Admission: RE | Admit: 2022-02-24 | Discharge: 2022-02-24 | Disposition: A | Discharge status: Home / Self Care | Payer: Managed Care, Other (non HMO) | Source: Ambulatory Visit | Attending: Orthopedic Surgery | Admitting: Orthopedic Surgery

## 2022-02-24 DIAGNOSIS — M545 Low back pain, unspecified: Secondary | ICD-10-CM

## 2022-03-09 ENCOUNTER — Other Ambulatory Visit: Payer: Self-pay | Admitting: Orthopedic Surgery

## 2022-03-09 DIAGNOSIS — M545 Low back pain, unspecified: Secondary | ICD-10-CM

## 2022-04-03 ENCOUNTER — Other Ambulatory Visit: Payer: Managed Care, Other (non HMO)

## 2022-05-06 ENCOUNTER — Ambulatory Visit
Admission: RE | Admit: 2022-05-06 | Discharge: 2022-05-06 | Disposition: A | Discharge status: Home / Self Care | Payer: Managed Care, Other (non HMO) | Source: Ambulatory Visit | Attending: Orthopedic Surgery | Admitting: Orthopedic Surgery

## 2022-05-06 DIAGNOSIS — G8929 Other chronic pain: Secondary | ICD-10-CM

## 2023-10-12 IMAGING — CT CT BIOPSY
1 of 4 series · 13 of 32 positions shown, 19 images · non-contrast
Comparison: none

CLINICAL DATA: 50-year-old gentleman with chronic right lower back
pain radiating into the right leg.

[Series 2: needle -guided injection · axial · 0.78mm/px · z∈[-372,-280]mm · 13 of 54 slices shown, 19 images]
[im 4/54  soft-tissue]
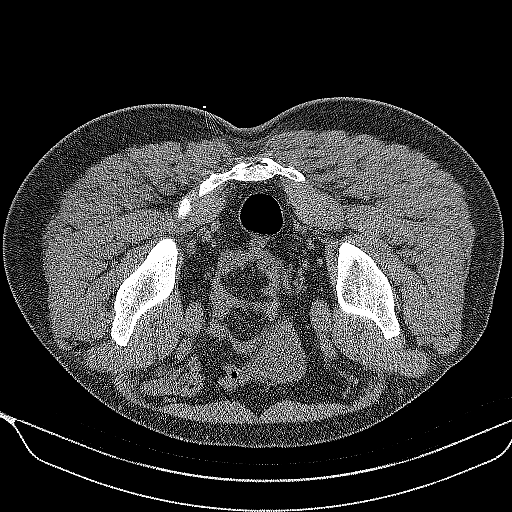
[im 4/54  bone]
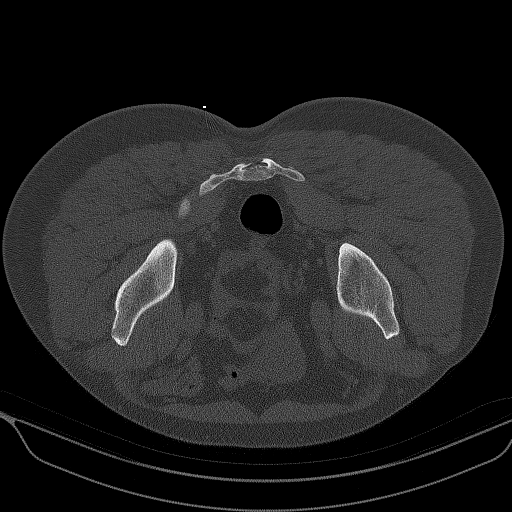
[im 8/54  soft-tissue]
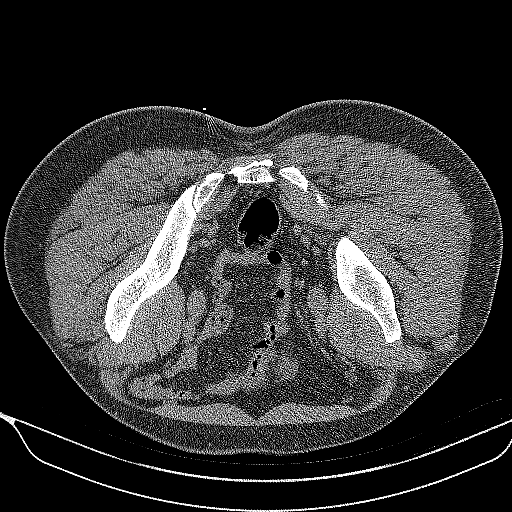
[im 12/54  soft-tissue]
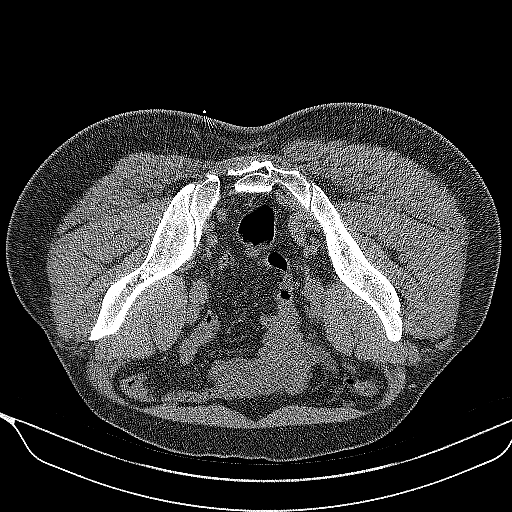
[im 16/54  soft-tissue]
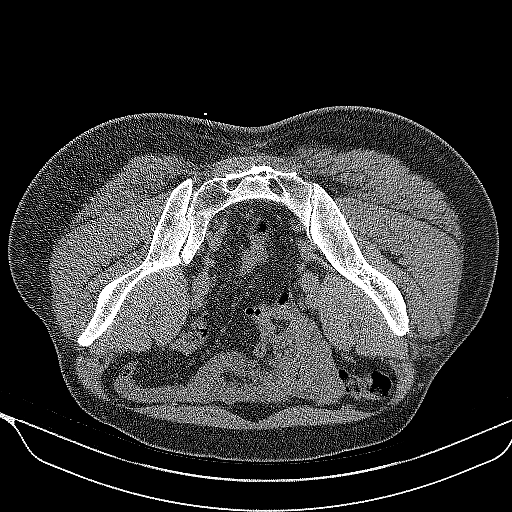
[im 19/54  soft-tissue]
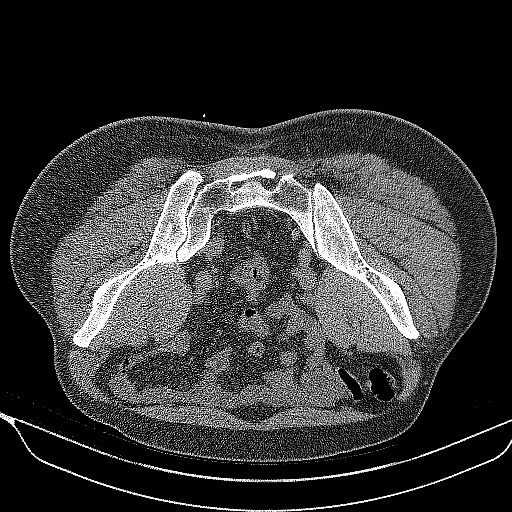
[im 23/54  soft-tissue]
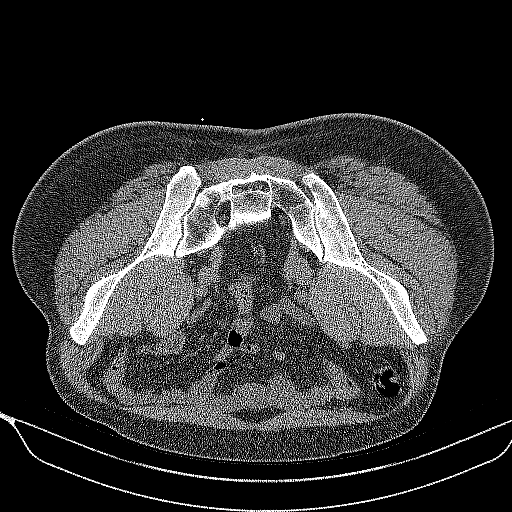
[im 27/54  soft-tissue]
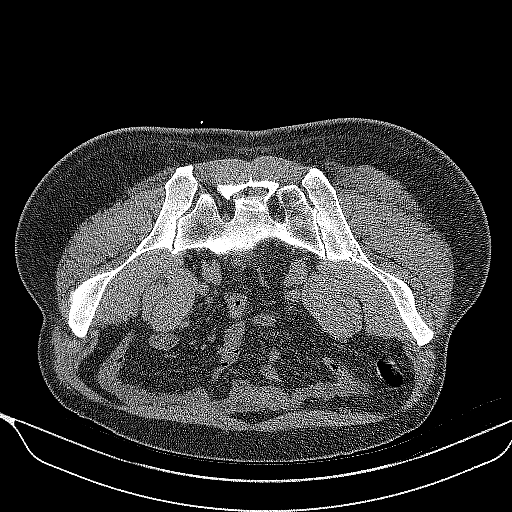
[im 31/54  soft-tissue]
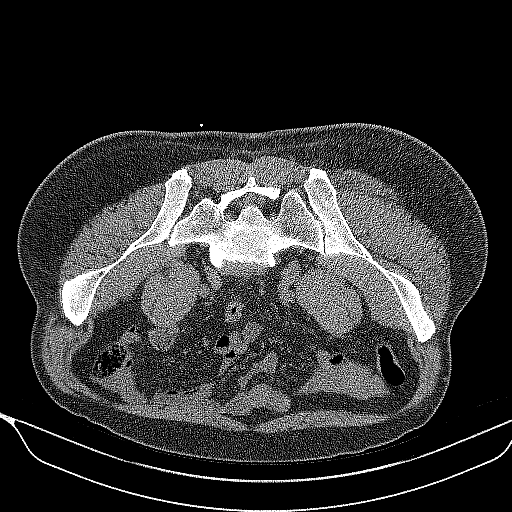
[im 35/54  soft-tissue]
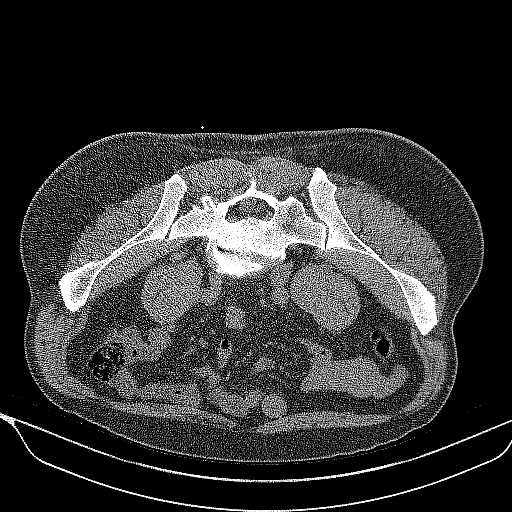
[im 35/54  bone]
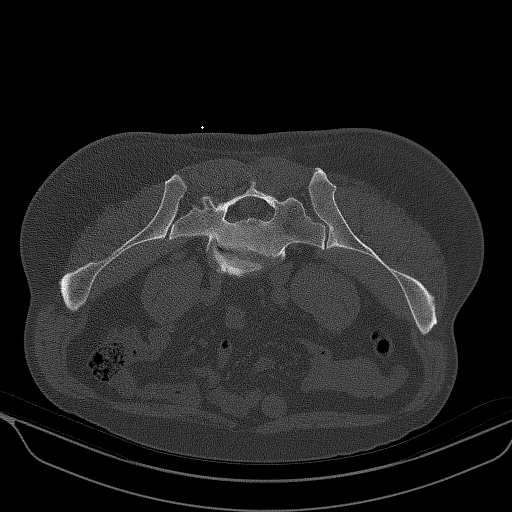
[im 38/54  soft-tissue]
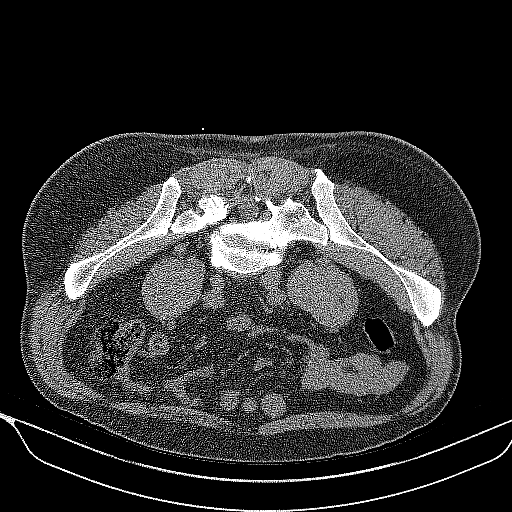
[im 38/54  lung]
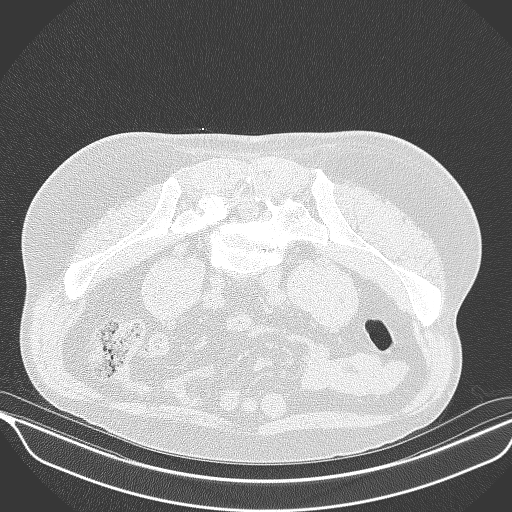
[im 42/54  soft-tissue]
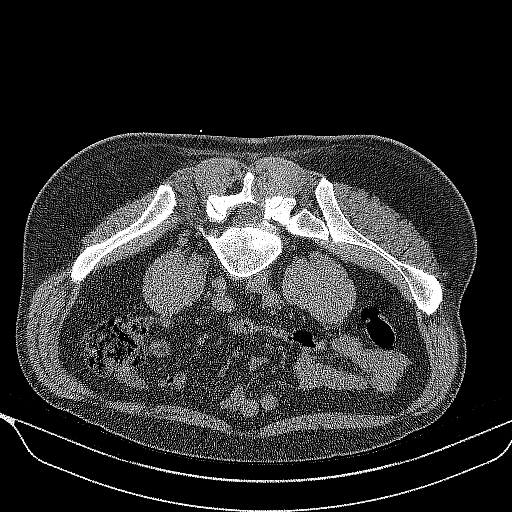
[im 42/54  lung]
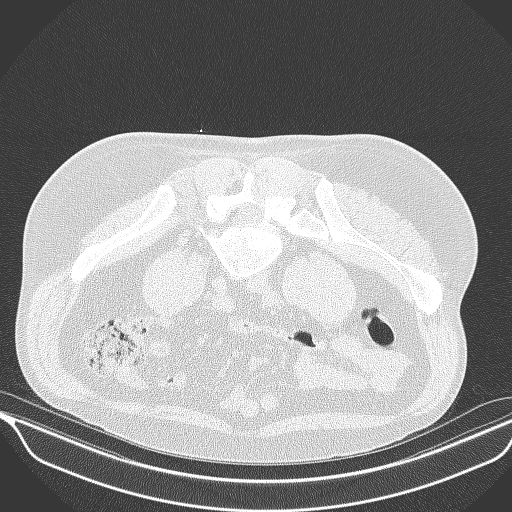
[im 46/54  soft-tissue]
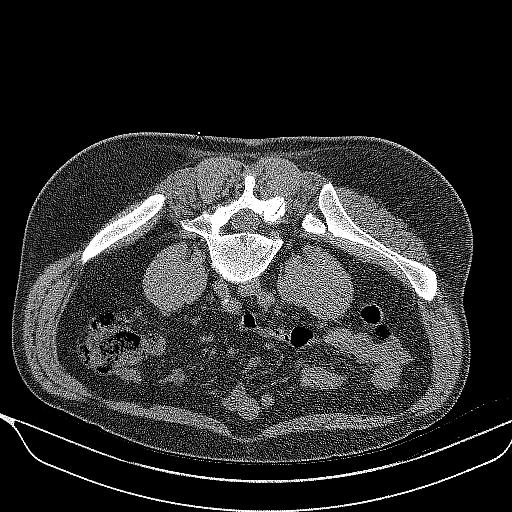
[im 46/54  lung]
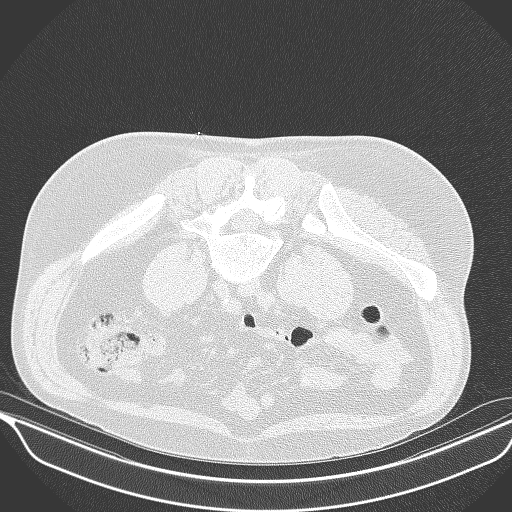
[im 50/54  soft-tissue]
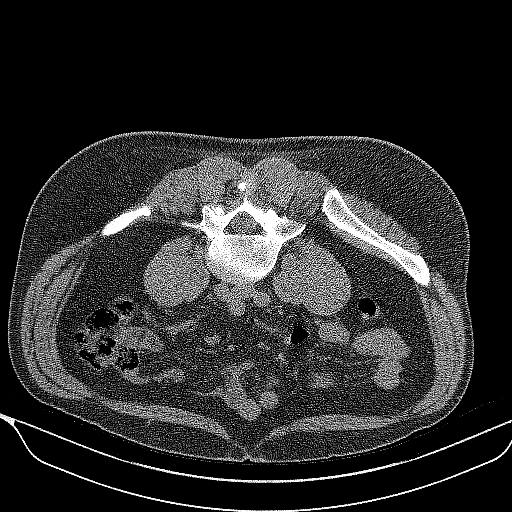
[im 50/54  lung]
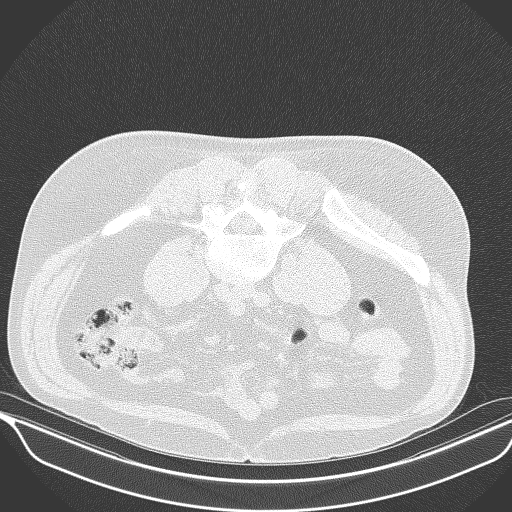

[13 of 32 positions shown; findings below may reference images not displayed]

PROCEDURE:
Right SI JOINT INJECTION.

After a thorough discussion of risks and benefits of the procedure,
including bleeding, infection, injury to nerves, blood vessels, and
adjacent structures, verbal and written consent was obtained.
Specific risks of the procedure included
nondiagnostic/nontherapeutic injection and non target injection.

The patient was placed prone on the CT table and localization was
performed over the sacrum. Target site marked using CT guidance. The
skin was prepped and draped in the usual sterile fashion using
Betadine soap.

After local anesthesia with 1% lidocaine without epinephrine and
subsequent deep anesthesia, a 20 gauge spinal needle was advanced
into the right SI joint. Subsequently, 2 mL of 0.5% Sensorcaine was
injected into SI joint. Needles removed and a sterile dressing
applied.

No complications were observed. The patient was observed and
released under the care of a driver after 30 minutes.
IMPRESSION: Successful CT guided right SI joint injection.
